# Patient Record
Sex: Male | Born: 1990 | Race: White | Hispanic: No | Marital: Single | Smoking: Current every day smoker
Health system: Southern US, Community
[De-identification: ages and names within clinical notes are randomized; demographics above are authoritative.]

---
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
## 2011-06-28 ENCOUNTER — Ambulatory Visit: Payer: Self-pay | Admitting: Cardiothoracic Surgery

## 2011-07-08 ENCOUNTER — Inpatient Hospital Stay: Payer: Self-pay | Admitting: Surgery

## 2011-07-24 ENCOUNTER — Ambulatory Visit: Payer: Self-pay | Admitting: Cardiothoracic Surgery

## 2011-07-28 ENCOUNTER — Ambulatory Visit: Payer: Self-pay | Admitting: Cardiothoracic Surgery

## 2011-08-07 ENCOUNTER — Emergency Department: Payer: Self-pay | Admitting: Emergency Medicine

## 2012-05-12 ENCOUNTER — Emergency Department: Payer: Self-pay | Admitting: *Deleted

## 2012-05-12 LAB — LIPASE, BLOOD: Lipase: 78 U/L (ref 73–393)

## 2012-05-12 LAB — COMPREHENSIVE METABOLIC PANEL
Albumin: 4.8 g/dL (ref 3.4–5.0)
Anion Gap: 10 (ref 7–16)
BUN: 8 mg/dL (ref 7–18)
Bilirubin,Total: 1.2 mg/dL — ABNORMAL HIGH (ref 0.2–1.0)
Calcium, Total: 9.7 mg/dL (ref 8.5–10.1)
Chloride: 101 mmol/L (ref 98–107)
EGFR (African American): >60
EGFR (Non-African Amer.): >60
Glucose: 98 mg/dL (ref 65–99)
SGOT(AST): 43 U/L — ABNORMAL HIGH (ref 15–37)
SGPT (ALT): 25 U/L
Total Protein: 9.6 g/dL — ABNORMAL HIGH (ref 6.4–8.2)

## 2013-07-08 IMAGING — CR DG CHEST 1V PORT
1 series · 1 of 1 positions shown · non-contrast
Comparison: none

REASON FOR EXAM: follow-up left chest tube.
COMMENTS:

[view not recorded]
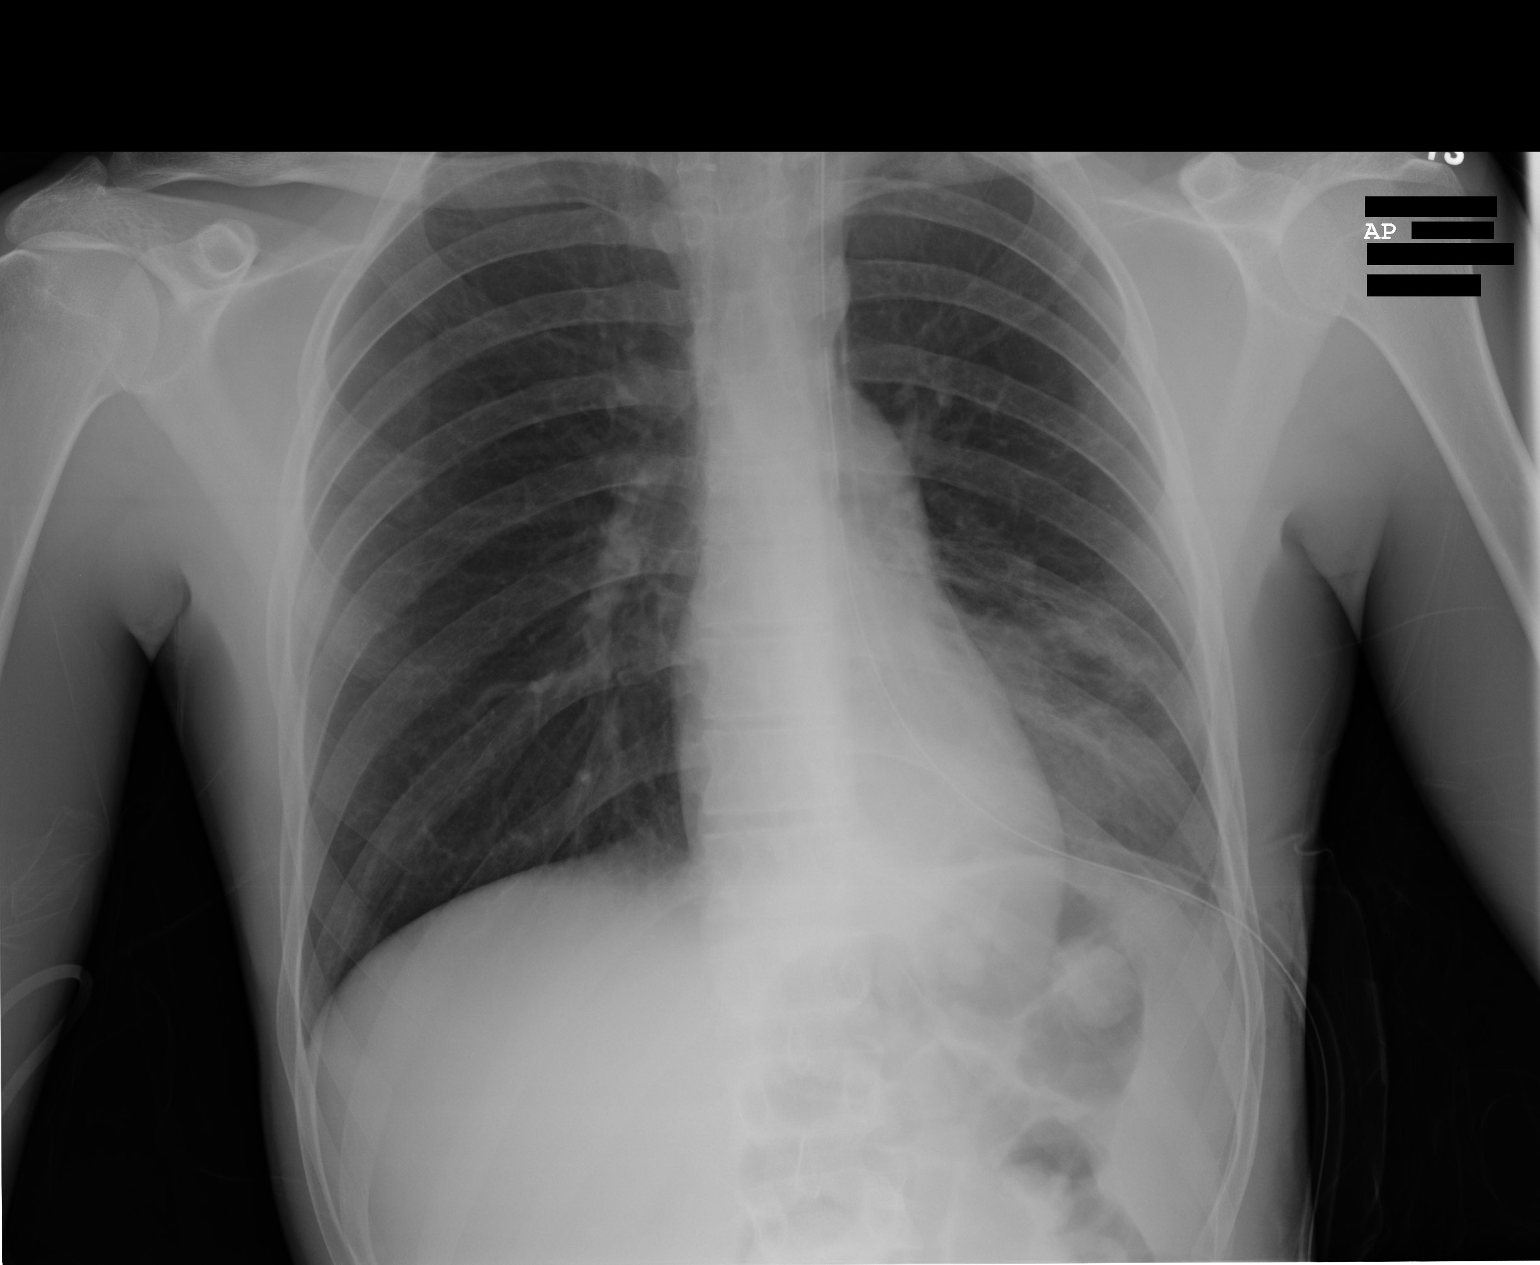

[1 of 1 positions shown; findings below may reference images not displayed]

PROCEDURE:     DXR - DXR PORTABLE CHEST SINGLE VIEW  - July 10, 2011  [DATE]

RESULT:     There has been a further decrease in the left pneumothorax. In
the current exam there is approximately 2.3 cm separation of visceral and
parietal pleura at the left apex. The left chest tube remains present. There
is a hazy increase in density at the left lung base compatible with
atelectasis that is somewhat less prominent than on the exam yesterday. The
right lung field remains clear. Heart size is normal.
IMPRESSION: 1. There has been further improvement in the previously noted pneumothorax
on the left.
2. A left chest tube remains present.
3. There is atelectasis at the left lung base, slightly improved as compared
to the prior exam.

## 2013-07-22 IMAGING — CR DG CHEST 2V
1 series · 3 of 3 positions shown · non-contrast
Comparison: none

REASON FOR EXAM: Atelectasis; expiratory and inspiratory films per Dr.
Rrushdi
COMMENTS:

[Series 1: view not recorded · 0.17mm/px · 3 of 3 slices shown]
[im 1/3]
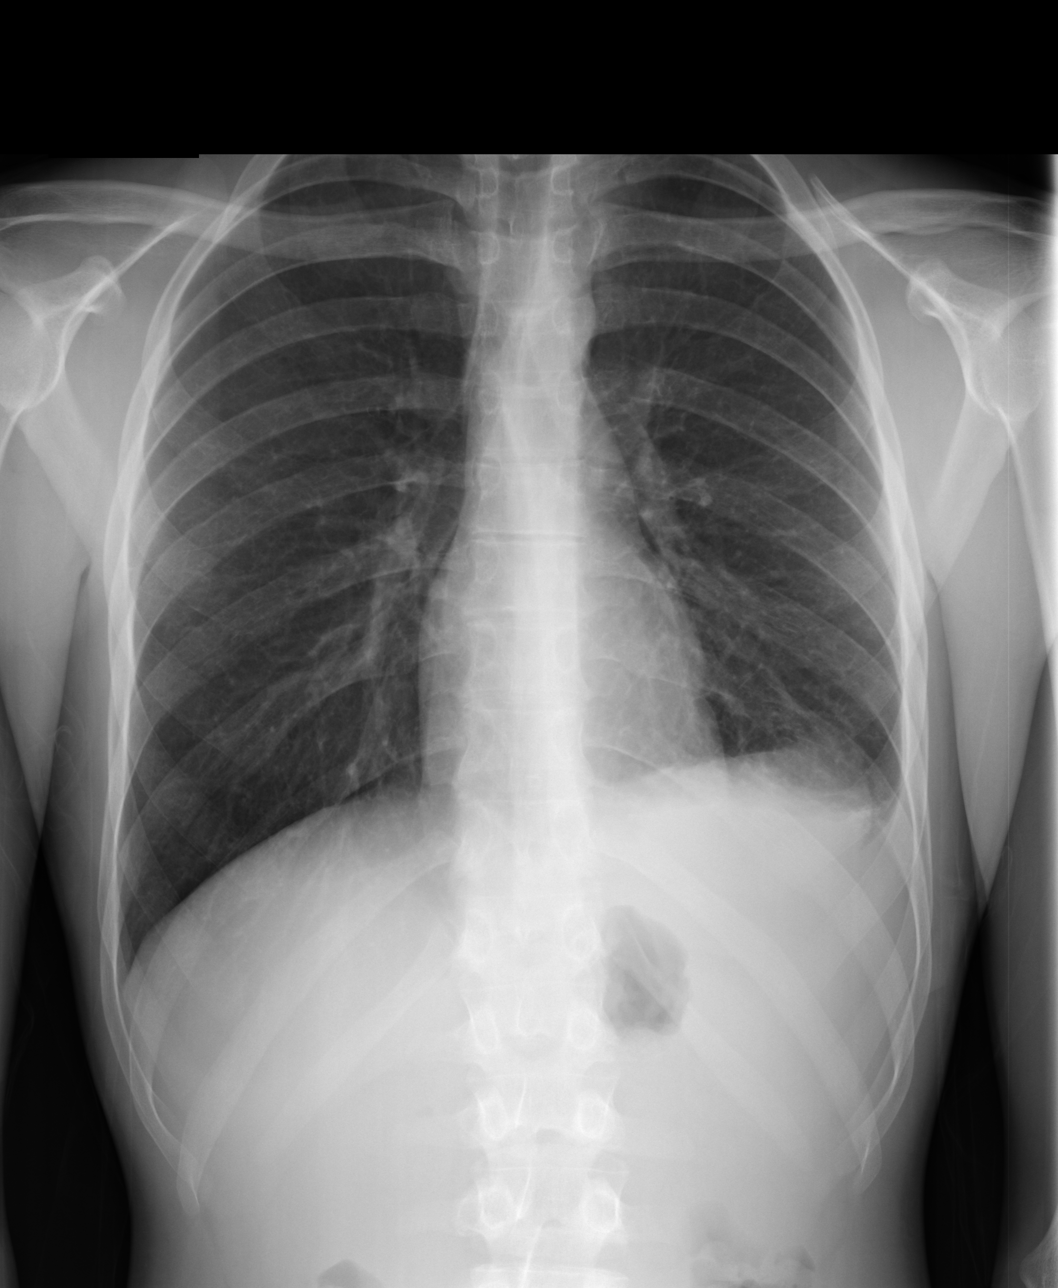
[im 2/3]
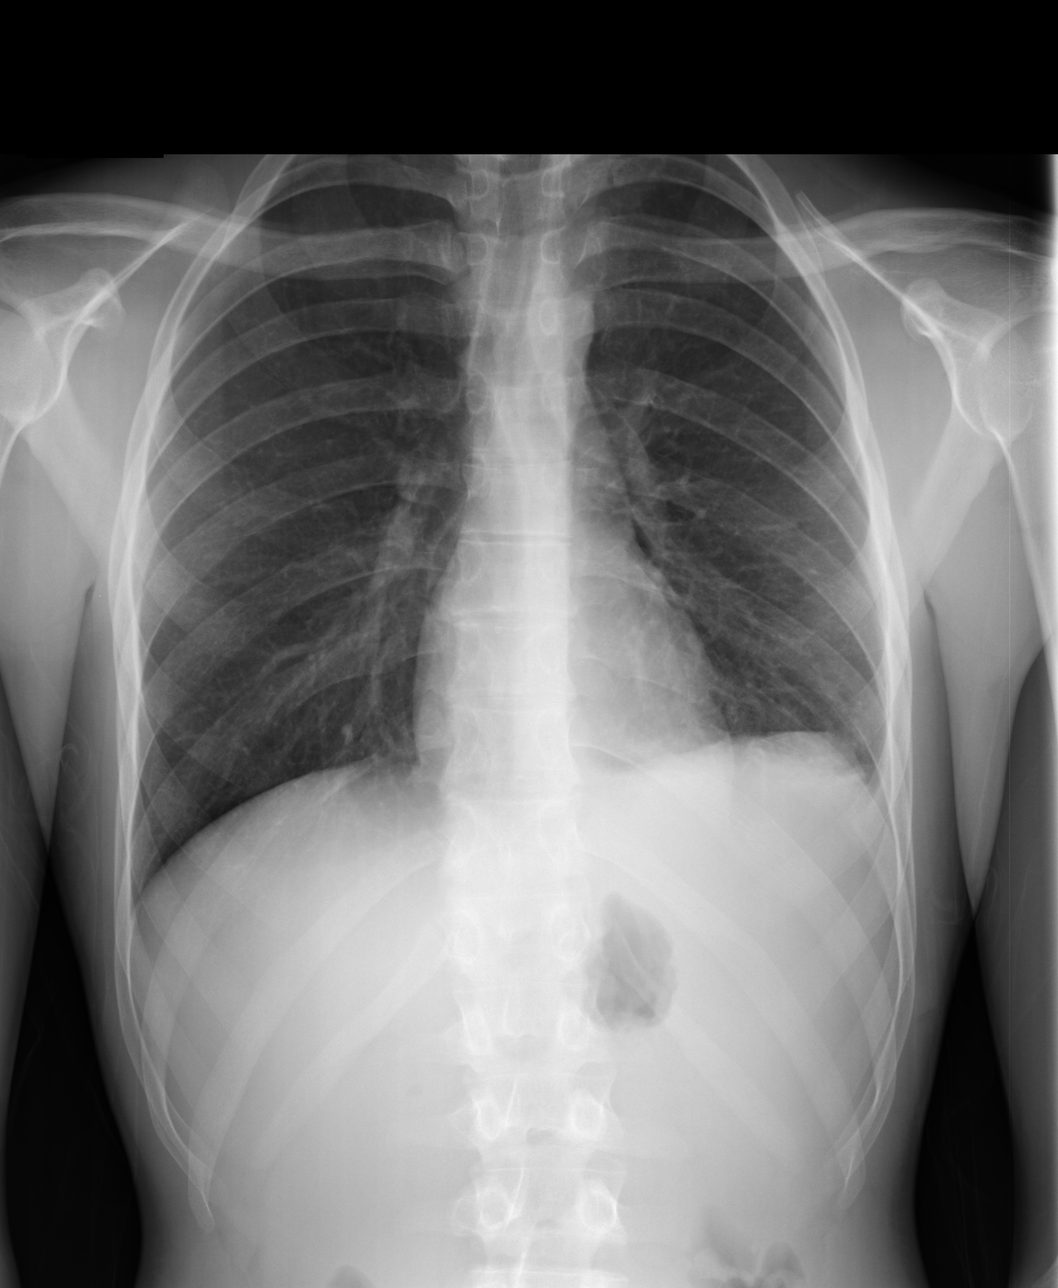
[im 3/3]
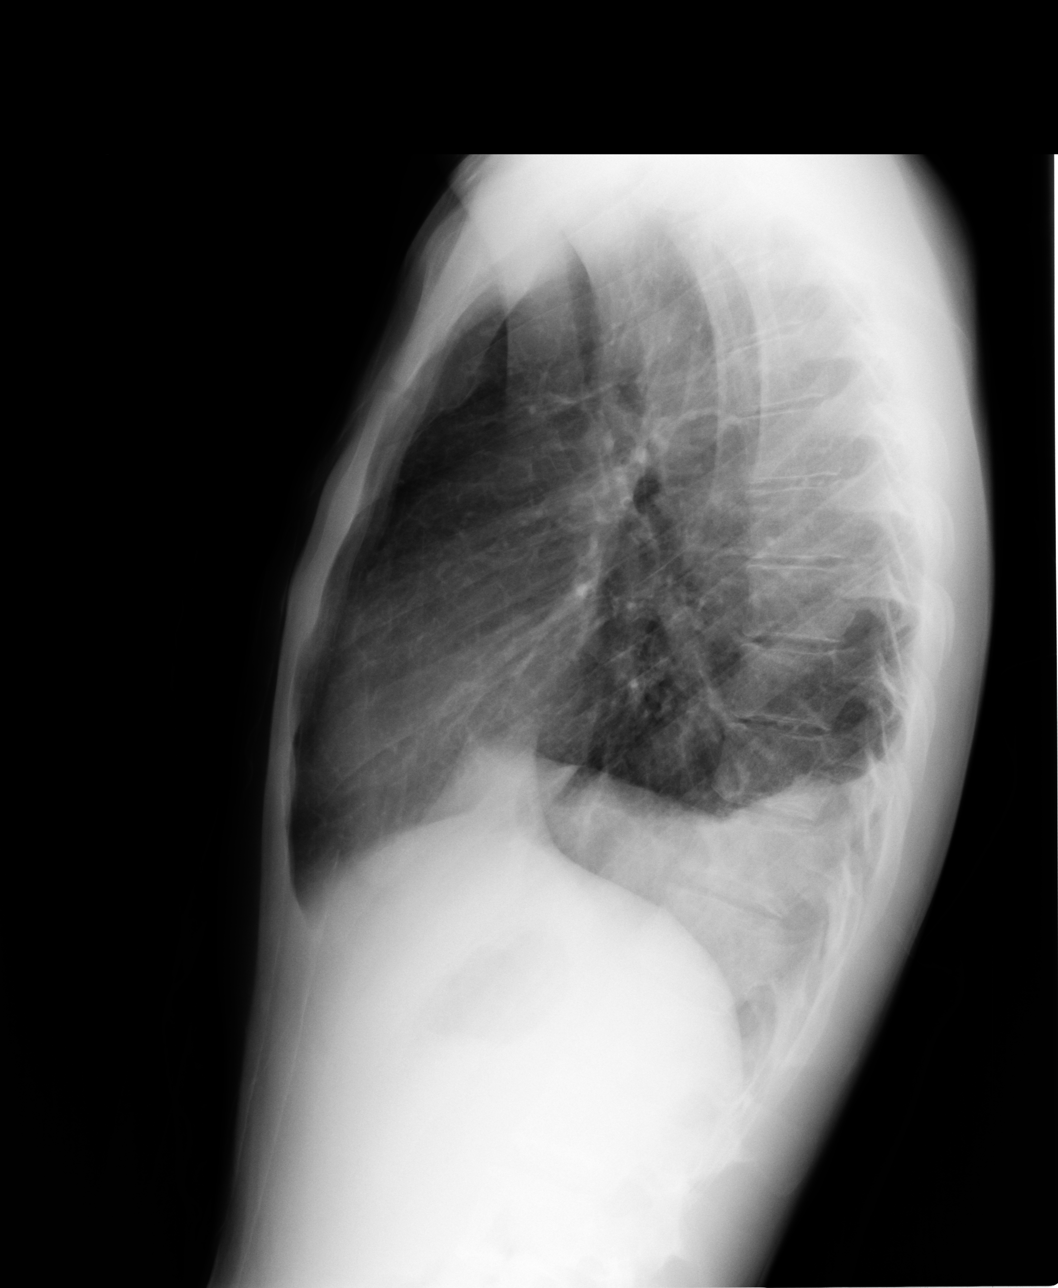

[3 of 3 positions shown; findings below may reference images not displayed]

PROCEDURE:     DXR - DXR CHEST PA (OR AP) AND LATERAL  - July 24, 2011 [DATE]

RESULT:     Comparison is made to the prior exam of 07/11/2011. PA views of
the chest were obtained in inspiration and expiration. A lateral view of the
chest was also obtained. There is increased density at the left base which
blunts the left posterior gutter and is compatible with effusion, fibrosis
or a combination. If clinically indicated this could be further evaluated by
lateral decubitus views of the chest or by chest CT. The right lung field is
clear. Heart size is normal. The chest is bilaterally hyperinflated.
IMPRESSION: 1. There persists increased density at the left base primarily at the left
posterior gutter.
2. No recurrent or residual pneumothorax is identified.
3. The chest is bilaterally hyperinflated.

## 2013-08-05 IMAGING — CR DG CHEST 2V
1 series · 2 of 2 positions shown · non-contrast
Comparison: none

REASON FOR EXAM: pain
COMMENTS:   May transport without cardiac monitor

[Series 1: w chest pa · 0.14mm/px · 2 of 2 slices shown]
[im 1/2]
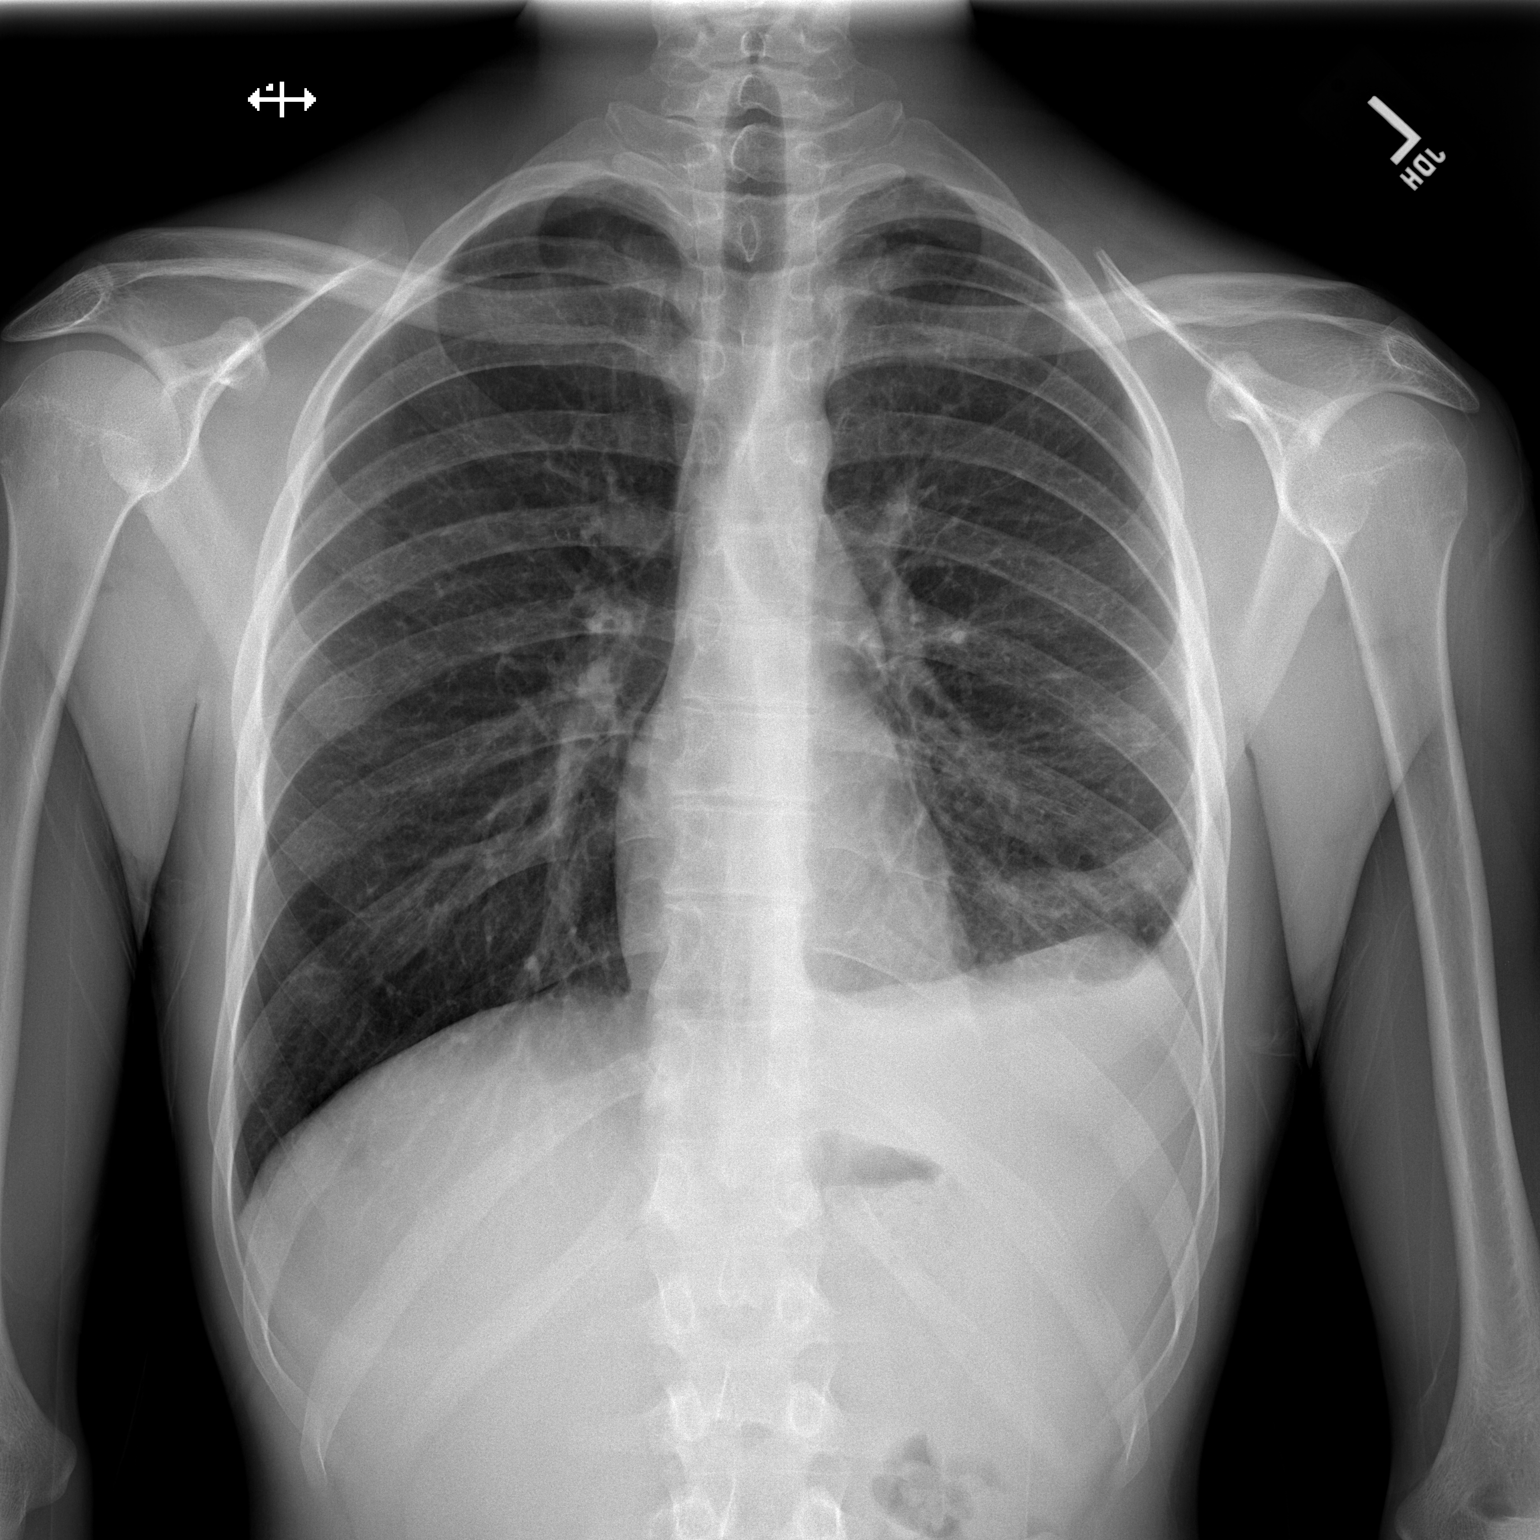
[im 2/2]
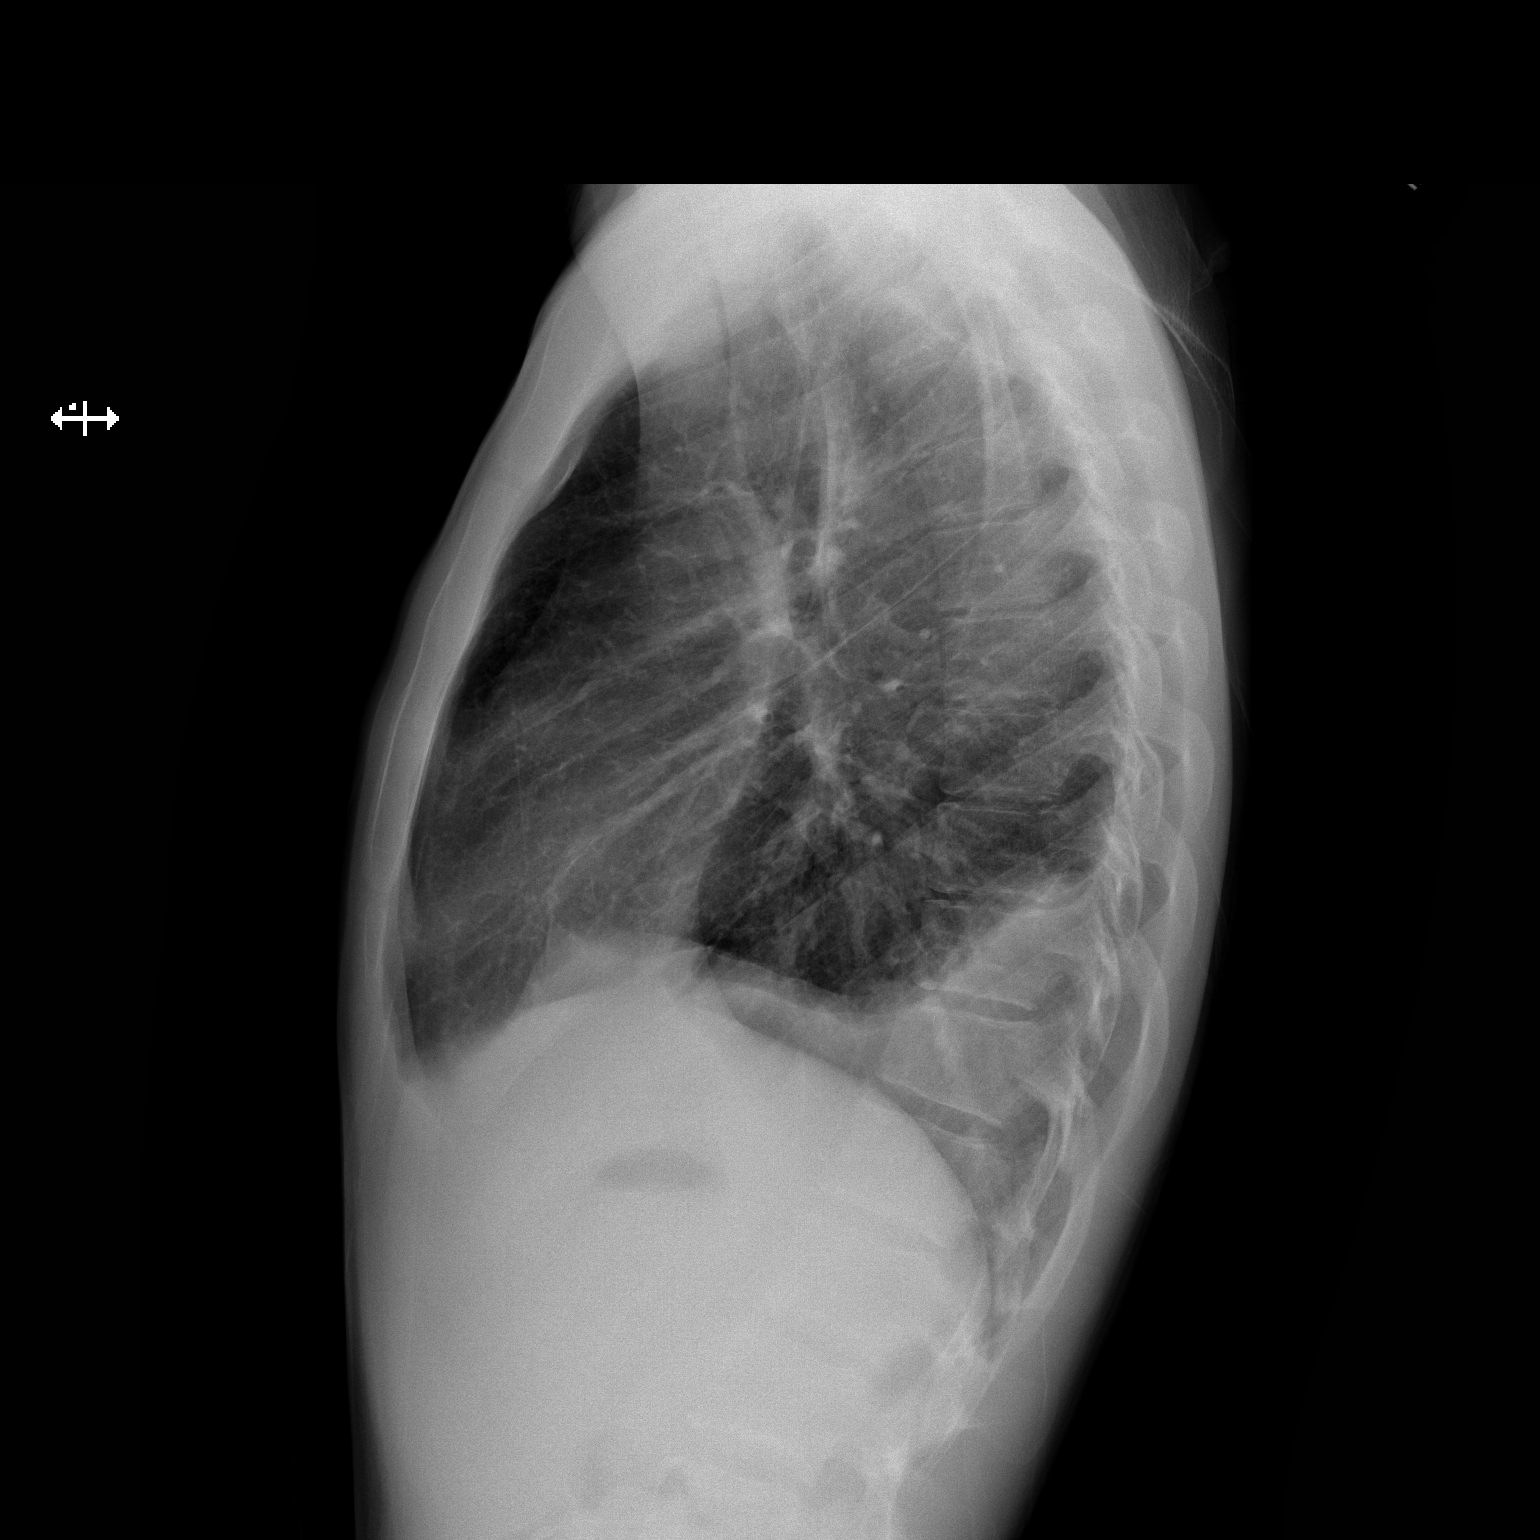

[2 of 2 positions shown; findings below may reference images not displayed]

PROCEDURE:     DXR - DXR CHEST PA (OR AP) AND LATERAL  - August 07, 2011  [DATE]

RESULT:     Comparison is made to the previous exam dated 07/24/2011.

Is blunting of the left costophrenic angle with a small left pleural
effusion demonstrated. There is no definite infiltrate, pneumothorax or
mass. The heart is normal in size. The effusion has been present but was
smaller on the previous study of 07/11/2011 and is essentially stable
compared to 07/24/2011.
IMPRESSION: Persistent left pleural effusion.

## 2019-09-29 ENCOUNTER — Other Ambulatory Visit: Payer: Self-pay

## 2019-09-29 DIAGNOSIS — Z20822 Contact with and (suspected) exposure to covid-19: Secondary | ICD-10-CM

## 2019-10-02 LAB — NOVEL CORONAVIRUS, NAA: SARS-CoV-2, NAA: NOT DETECTED

## 2020-07-05 ENCOUNTER — Ambulatory Visit
Admission: EM | Admit: 2020-07-05 | Discharge: 2020-07-05 | Disposition: A | Discharge status: Home / Self Care | Payer: BC Managed Care – PPO | Attending: Family Medicine | Admitting: Family Medicine

## 2020-07-05 DIAGNOSIS — H9202 Otalgia, left ear: Secondary | ICD-10-CM | POA: Diagnosis not present

## 2020-07-05 DIAGNOSIS — H60391 Other infective otitis externa, right ear: Secondary | ICD-10-CM | POA: Diagnosis not present

## 2020-07-05 MED ORDER — CIPROFLOXACIN-DEXAMETHASONE 0.3-0.1 % OT SUSP: 4.0000 [drp] | Freq: Two times a day (BID) | OTIC | 0 refills | Status: AC

## 2020-07-05 NOTE — ED Provider Notes (Signed)
[facility]   [id number] 07/05/20 Arrival Time: 1030  CC: EAR PAIN  SUBJECTIVE: History from: patient.  Eduardo Schneider is a 29 y.o. male who presents with of right ear pain for the past 2 days. Denies a precipitating event, such as swimming or wearing ear plugs. Patient states the pain is constant and achy in character. Patient has not taken OTC medications for this. Symptoms are made worse with lying down. Denies similar symptoms in the past. Denies fever, chills, fatigue, sinus pain, rhinorrhea, ear discharge, sore throat, SOB, wheezing, chest pain, nausea, changes in bowel or bladder habits.    ROS: As per HPI.  All other pertinent ROS negative.     Past Medical History:  Diagnosis Date  . Spontaneous pneumothorax 2012   Left side    History reviewed. No pertinent surgical history. No Known Allergies No current facility-administered medications on file prior to encounter.   No current outpatient medications on file prior to encounter.   Social History   Socioeconomic History  . Marital status: Single    Spouse name: Not on file  . Number of children: Not on file  . Years of education: Not on file  . Highest education level: Not on file  Occupational History  . Not on file  Tobacco Use  . Smoking status: Current Every Day Smoker    Packs/day: 0.50    Years: 10.00    Pack years: 5.00    Types: Cigarettes  . Smokeless tobacco: Never Used  Vaping Use  . Vaping Use: Never used  Substance and Sexual Activity  . Alcohol use: Yes    Comment: occasional   . Drug use: Never  . Sexual activity: Not on file  Other Topics Concern  . Not on file  Social History Narrative  . Not on file   Social Determinants of Health   Financial Resource Strain:   . Difficulty of Paying Living Expenses: Not on file  Food Insecurity:   . Worried About Programme researcher, broadcasting/film/video in the Last Year: Not on file  . Ran Out of Food in the Last Year: Not on file  Transportation Needs:     . Lack of Transportation (Medical): Not on file  . Lack of Transportation (Non-Medical): Not on file  Physical Activity:   . Days of Exercise per Week: Not on file  . Minutes of Exercise per Session: Not on file  Stress:   . Feeling of Stress : Not on file  Social Connections:   . Frequency of Communication with Friends and Family: Not on file  . Frequency of Social Gatherings with Friends and Family: Not on file  . Attends Religious Services: Not on file  . Active Member of Clubs or Organizations: Not on file  . Attends Banker Meetings: Not on file  . Marital Status: Not on file  Intimate Partner Violence:   . Fear of Current or Ex-Partner: Not on file  . Emotionally Abused: Not on file  . Physically Abused: Not on file  . Sexually Abused: Not on file   History reviewed. No pertinent family history.  OBJECTIVE:  Vitals:   07/05/20 1054  BP: (!) 149/110  Pulse: 84  Resp: 16  Temp: 98.5 F (36.9 C)  TempSrc: Oral  SpO2: 95%     General appearance: alert; appears fatigued HEENT: Ears: L EACs clear, R EAC with erythema, swelling, tenderness, TMs pearly gray with visible cone of light, without erythema; Eyes: PERRL, EOMI  grossly; Sinuses nontender to palpation; Nose: clear rhinorrhea; Throat: oropharynx mildly erythematous, tonsils 1+ without white tonsillar exudates, uvula midline Neck: supple without LAD Lungs: unlabored respirations, symmetrical air entry; cough: absent; no respiratory distress Heart: regular rate and rhythm.  Radial pulses 2+ symmetrical bilaterally Skin: warm and dry Psychological: alert and cooperative; normal mood and affect  Imaging: No results found.   ASSESSMENT & PLAN:  1. Otalgia, left   2. Infective otitis externa of right ear     Meds ordered this encounter  Medications  . ciprofloxacin-dexamethasone (CIPRODEX) OTIC suspension    Sig: Place 4 drops into the right ear 2 (two) times daily.    Dispense:  7.5 mL     Refill:  0    Order Specific Question:   Supervising Provider    Answer:   Merrilee Jansky [[id number]]    Rest and drink plenty of fluids Prescribed ciprodex ear drops  Tried removing cerumen from the R EAC, with no success Take medications as directed and to completion Continue to use OTC ibuprofen and/ or tylenol as needed for pain control Follow up with PCP if symptoms persists Return here or go to the ER if you have any new or worsening symptoms   Reviewed expectations re: course of current medical issues. Questions answered. Outlined signs and symptoms indicating need for more acute intervention. Patient verbalized understanding. After Visit Summary given.         [name], NP 07/05/20 1222

## 2020-07-05 NOTE — Discharge Instructions (Signed)
I have sent in drops for you to use in the right ear. 4 drops per day, twice a day for 7 days  Follow up with this office or with primary care if symptoms are persisting

## 2020-07-05 NOTE — ED Triage Notes (Signed)
Pt presents with c/o R ear pain starting two days ago.  Took some decongestant medicine someone had at work and pain was improved.  No pain at this time but feels that hearing in R ear has changed.  Denies nasal congestion.  Did have HA a couple days ago but feels it came from the ear pain. Has no h/o issues in ears.
